# Patient Record
Sex: Male | Born: 1955 | Race: White | Hispanic: No | Marital: Married | Smoking: Never smoker
Health system: Southern US, Community
[De-identification: ages and names within clinical notes are randomized; demographics above are authoritative.]

## PROBLEM LIST (undated history)

## (undated) DIAGNOSIS — C801 Malignant (primary) neoplasm, unspecified: Secondary | ICD-10-CM

## (undated) DIAGNOSIS — E785 Hyperlipidemia, unspecified: Secondary | ICD-10-CM

## (undated) HISTORY — PX: OTHER SURGICAL HISTORY: SHX169

## (undated) HISTORY — DX: Hyperlipidemia, unspecified: E78.5

## (undated) HISTORY — PX: APPENDECTOMY: SHX54

## (undated) HISTORY — PX: PROSTATECTOMY: SHX69

## (undated) HISTORY — DX: Malignant (primary) neoplasm, unspecified: C80.1

---

## 2006-04-14 ENCOUNTER — Ambulatory Visit: Payer: Self-pay | Admitting: Gastroenterology

## 2010-03-27 ENCOUNTER — Ambulatory Visit: Payer: Self-pay | Admitting: Internal Medicine

## 2012-01-13 IMAGING — CR DG CHEST 2V
1 series · 3 of 3 positions shown · non-contrast
Comparison: none

REASON FOR EXAM: sob
COMMENTS:

PROCEDURE:     DXR - DXR CHEST PA (OR AP) AND LATERAL  - March 27, 2010 [DATE]
RESULT:     The lungs are well-expanded and clear. The cardiac silhouette is
normal in size. The pulmonary vascularity is not its course. There is no
pleural effusion.

[Series 1: view not recorded · 0.17mm/px · 3 of 3 slices shown]
[im 1/3]
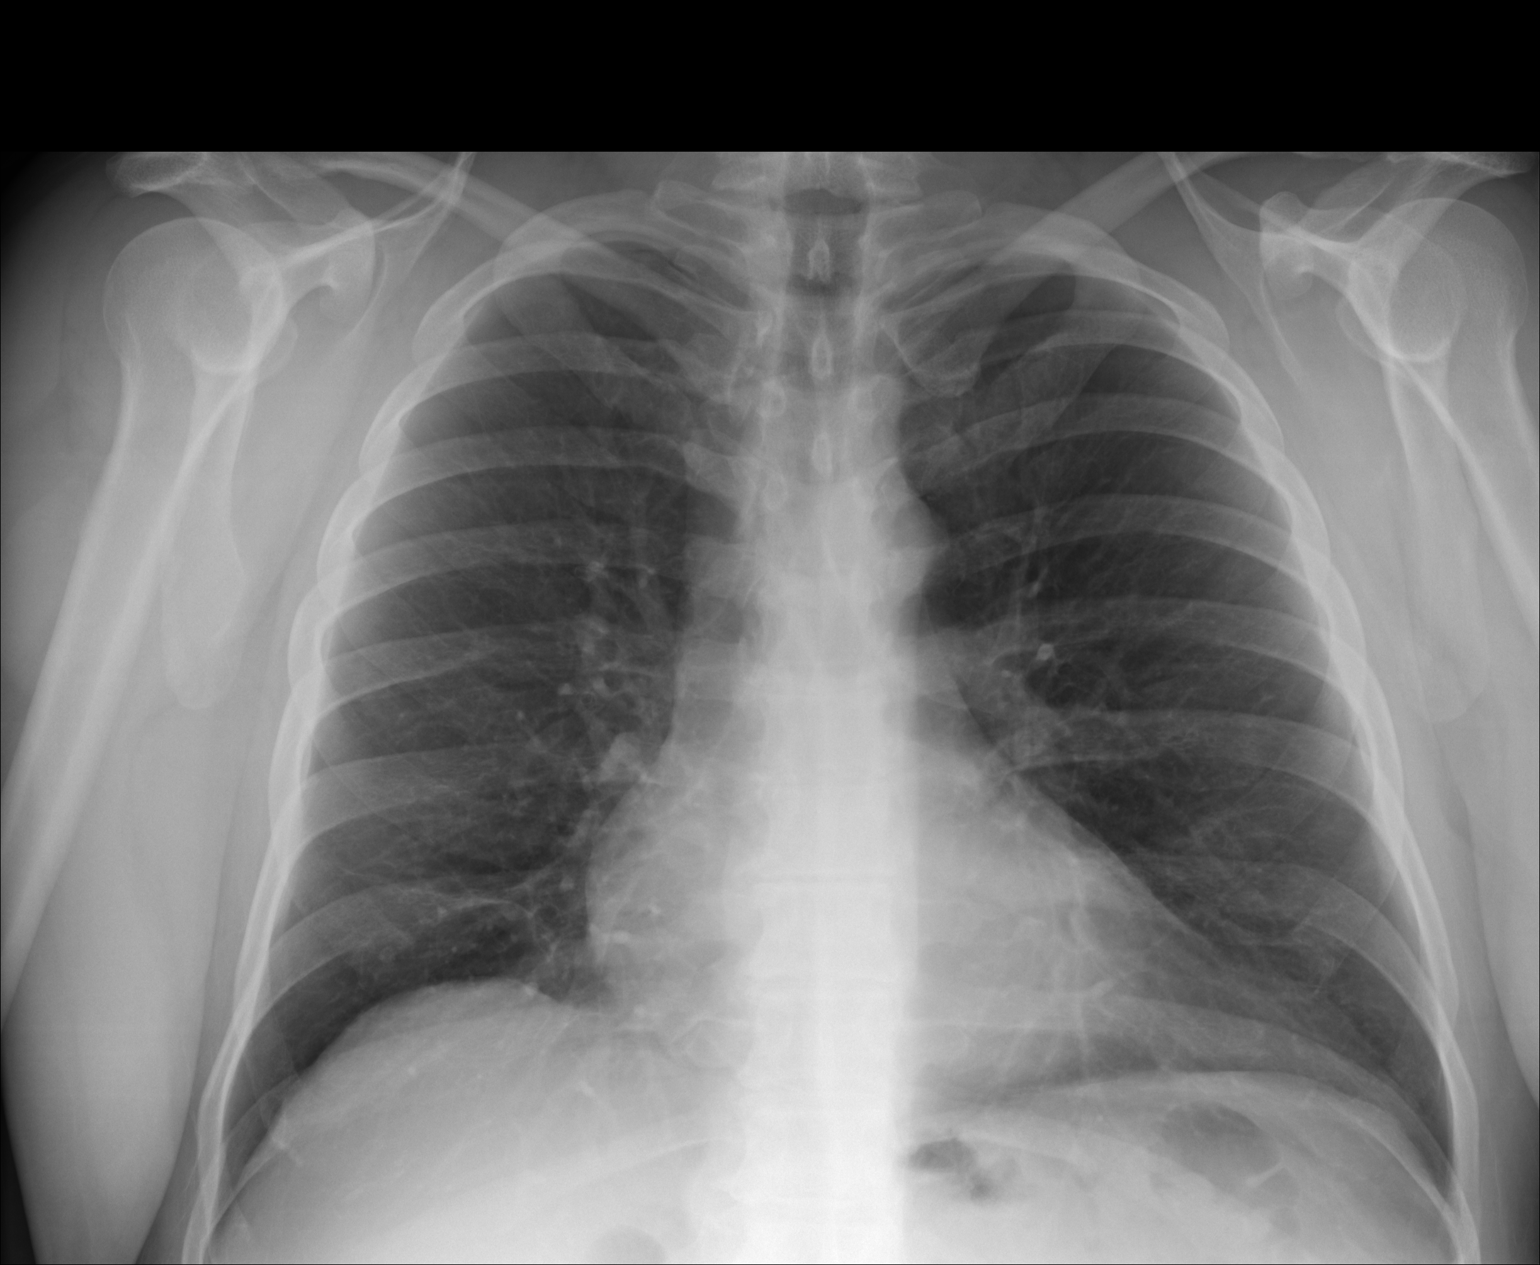
[im 2/3]
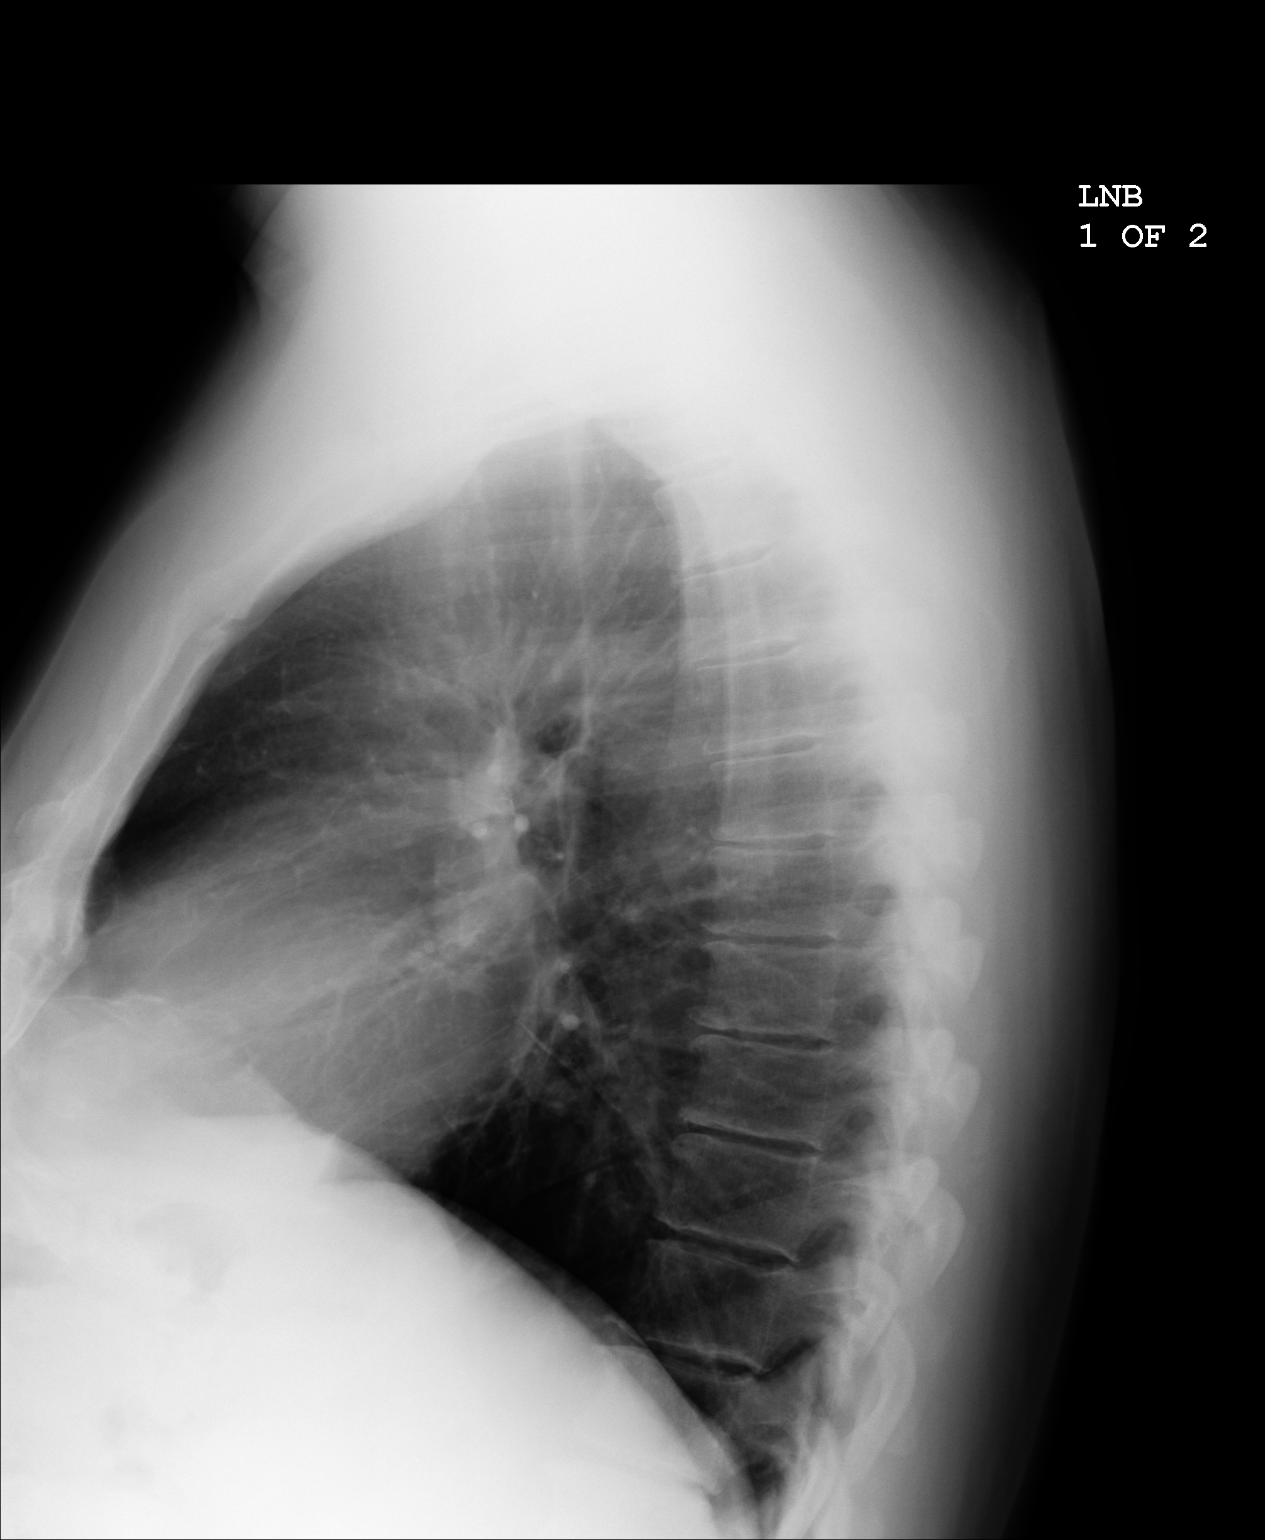
[im 3/3]
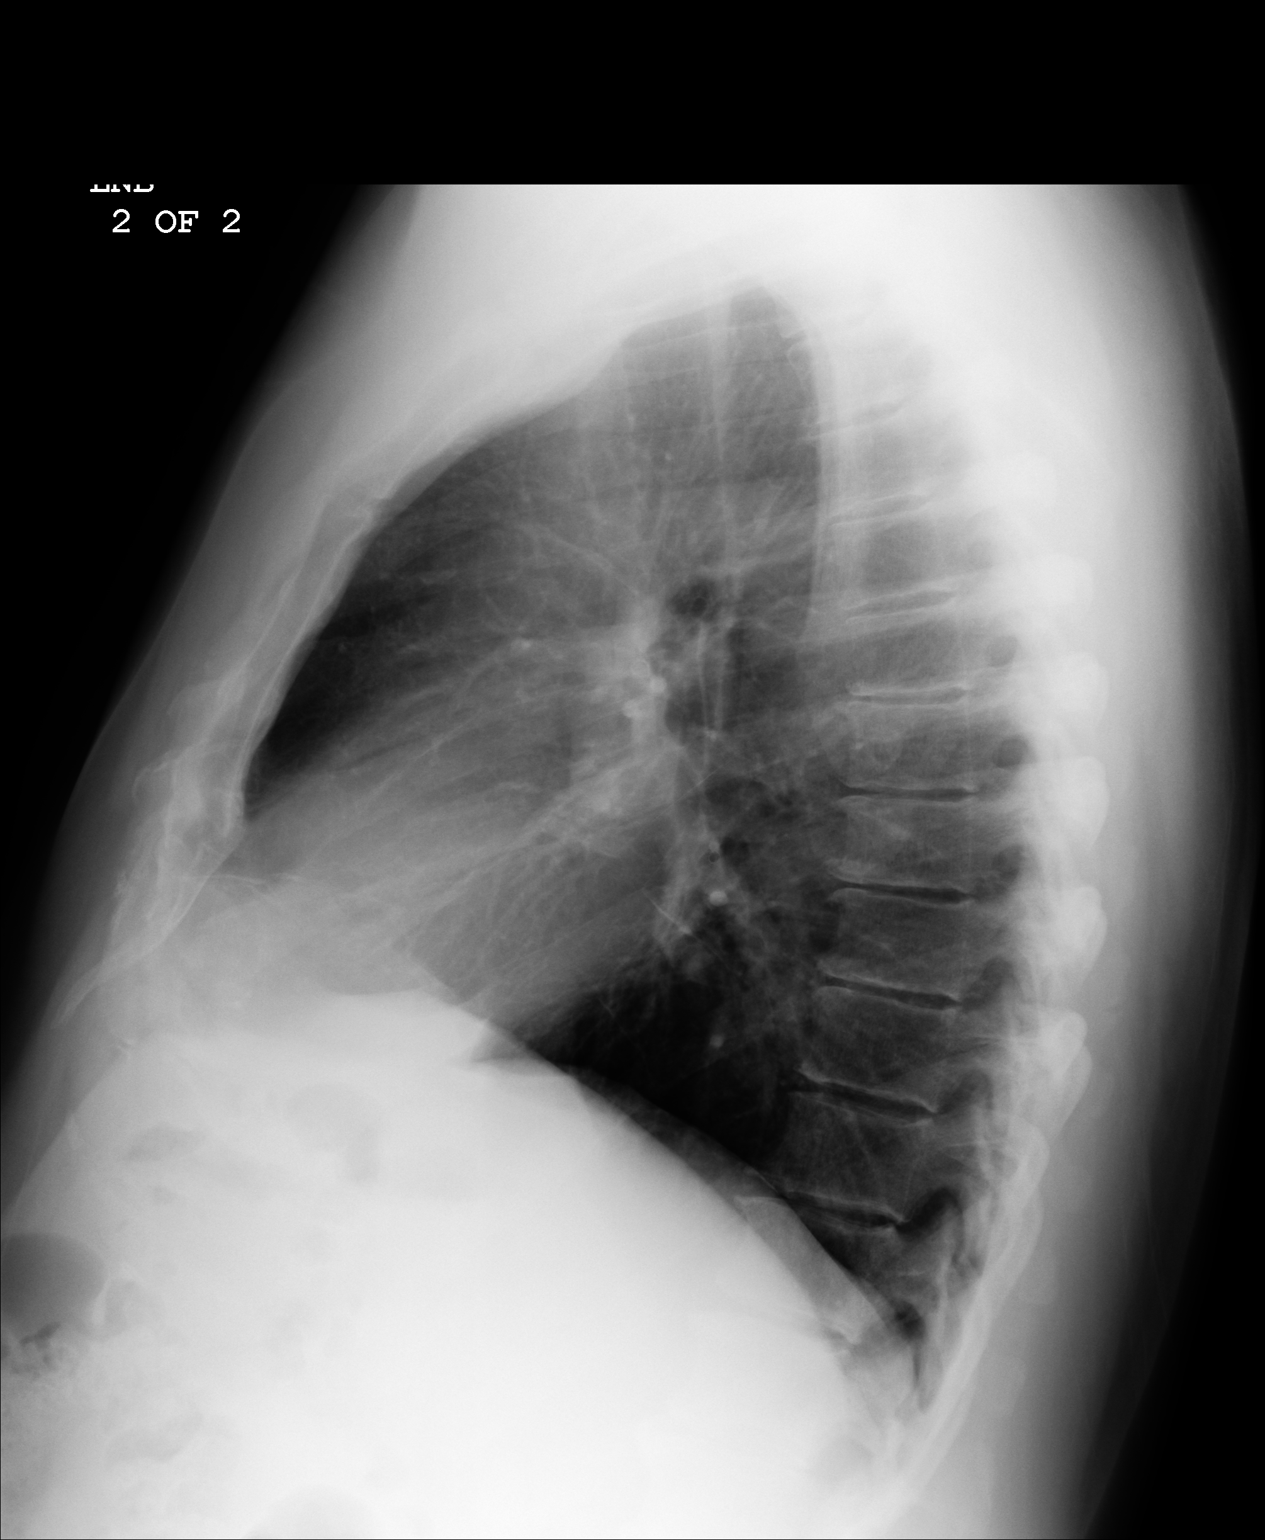

[3 of 3 positions shown; findings below may reference images not displayed]

IMPRESSION: I do not see evidence of pneumonia nor other acute
cardiopulmonary abnormality.

## 2014-11-14 DIAGNOSIS — N320 Bladder-neck obstruction: Secondary | ICD-10-CM | POA: Insufficient documentation

## 2014-11-14 DIAGNOSIS — C61 Malignant neoplasm of prostate: Secondary | ICD-10-CM | POA: Insufficient documentation

## 2015-01-18 DIAGNOSIS — E7800 Pure hypercholesterolemia, unspecified: Secondary | ICD-10-CM | POA: Insufficient documentation

## 2016-01-14 DIAGNOSIS — Z6833 Body mass index (BMI) 33.0-33.9, adult: Secondary | ICD-10-CM | POA: Diagnosis not present

## 2016-01-14 DIAGNOSIS — C61 Malignant neoplasm of prostate: Secondary | ICD-10-CM | POA: Diagnosis not present

## 2016-01-14 DIAGNOSIS — Z79899 Other long term (current) drug therapy: Secondary | ICD-10-CM | POA: Diagnosis not present

## 2016-04-10 DIAGNOSIS — Z0000 Encounter for general adult medical examination without abnormal findings: Secondary | ICD-10-CM | POA: Diagnosis not present

## 2016-04-10 DIAGNOSIS — I10 Essential (primary) hypertension: Secondary | ICD-10-CM | POA: Diagnosis not present

## 2016-04-10 DIAGNOSIS — R7301 Impaired fasting glucose: Secondary | ICD-10-CM | POA: Diagnosis not present

## 2016-04-14 DIAGNOSIS — D485 Neoplasm of uncertain behavior of skin: Secondary | ICD-10-CM | POA: Diagnosis not present

## 2016-04-14 DIAGNOSIS — Z85828 Personal history of other malignant neoplasm of skin: Secondary | ICD-10-CM | POA: Diagnosis not present

## 2016-04-14 DIAGNOSIS — D0471 Carcinoma in situ of skin of right lower limb, including hip: Secondary | ICD-10-CM | POA: Diagnosis not present

## 2016-04-14 DIAGNOSIS — Z08 Encounter for follow-up examination after completed treatment for malignant neoplasm: Secondary | ICD-10-CM | POA: Diagnosis not present

## 2016-04-14 DIAGNOSIS — L570 Actinic keratosis: Secondary | ICD-10-CM | POA: Diagnosis not present

## 2016-04-15 DIAGNOSIS — R001 Bradycardia, unspecified: Secondary | ICD-10-CM | POA: Diagnosis not present

## 2016-04-15 DIAGNOSIS — Z8546 Personal history of malignant neoplasm of prostate: Secondary | ICD-10-CM | POA: Diagnosis not present

## 2016-04-15 DIAGNOSIS — N181 Chronic kidney disease, stage 1: Secondary | ICD-10-CM | POA: Diagnosis not present

## 2016-06-16 DIAGNOSIS — D0471 Carcinoma in situ of skin of right lower limb, including hip: Secondary | ICD-10-CM | POA: Diagnosis not present

## 2016-06-16 DIAGNOSIS — L570 Actinic keratosis: Secondary | ICD-10-CM | POA: Diagnosis not present

## 2016-07-09 DIAGNOSIS — E6609 Other obesity due to excess calories: Secondary | ICD-10-CM | POA: Diagnosis not present

## 2016-07-09 DIAGNOSIS — Z23 Encounter for immunization: Secondary | ICD-10-CM | POA: Diagnosis not present

## 2016-07-09 DIAGNOSIS — N181 Chronic kidney disease, stage 1: Secondary | ICD-10-CM | POA: Diagnosis not present

## 2016-07-09 DIAGNOSIS — M7662 Achilles tendinitis, left leg: Secondary | ICD-10-CM | POA: Diagnosis not present

## 2016-07-09 DIAGNOSIS — Z0001 Encounter for general adult medical examination with abnormal findings: Secondary | ICD-10-CM | POA: Diagnosis not present

## 2016-07-14 DIAGNOSIS — Z6834 Body mass index (BMI) 34.0-34.9, adult: Secondary | ICD-10-CM | POA: Diagnosis not present

## 2016-07-14 DIAGNOSIS — C61 Malignant neoplasm of prostate: Secondary | ICD-10-CM | POA: Diagnosis not present

## 2016-07-14 DIAGNOSIS — R102 Pelvic and perineal pain: Secondary | ICD-10-CM | POA: Diagnosis not present

## 2016-12-15 DIAGNOSIS — L570 Actinic keratosis: Secondary | ICD-10-CM | POA: Diagnosis not present

## 2016-12-15 DIAGNOSIS — Z85828 Personal history of other malignant neoplasm of skin: Secondary | ICD-10-CM | POA: Diagnosis not present

## 2016-12-15 DIAGNOSIS — Z08 Encounter for follow-up examination after completed treatment for malignant neoplasm: Secondary | ICD-10-CM | POA: Diagnosis not present

## 2017-01-06 DIAGNOSIS — N181 Chronic kidney disease, stage 1: Secondary | ICD-10-CM | POA: Diagnosis not present

## 2017-01-06 DIAGNOSIS — E782 Mixed hyperlipidemia: Secondary | ICD-10-CM | POA: Diagnosis not present

## 2017-01-26 DIAGNOSIS — K9189 Other postprocedural complications and disorders of digestive system: Secondary | ICD-10-CM | POA: Diagnosis not present

## 2017-01-26 DIAGNOSIS — Z6834 Body mass index (BMI) 34.0-34.9, adult: Secondary | ICD-10-CM | POA: Diagnosis not present

## 2017-01-26 DIAGNOSIS — Z79899 Other long term (current) drug therapy: Secondary | ICD-10-CM | POA: Diagnosis not present

## 2017-01-26 DIAGNOSIS — K567 Ileus, unspecified: Secondary | ICD-10-CM | POA: Diagnosis not present

## 2017-01-26 DIAGNOSIS — C61 Malignant neoplasm of prostate: Secondary | ICD-10-CM | POA: Diagnosis not present

## 2017-01-26 DIAGNOSIS — Z9889 Other specified postprocedural states: Secondary | ICD-10-CM | POA: Diagnosis not present

## 2017-07-07 DIAGNOSIS — E7800 Pure hypercholesterolemia, unspecified: Secondary | ICD-10-CM | POA: Diagnosis not present

## 2017-07-07 DIAGNOSIS — Z111 Encounter for screening for respiratory tuberculosis: Secondary | ICD-10-CM | POA: Diagnosis not present

## 2017-07-07 DIAGNOSIS — Z0001 Encounter for general adult medical examination with abnormal findings: Secondary | ICD-10-CM | POA: Diagnosis not present

## 2017-07-07 DIAGNOSIS — Z1159 Encounter for screening for other viral diseases: Secondary | ICD-10-CM | POA: Diagnosis not present

## 2017-08-20 DIAGNOSIS — N529 Male erectile dysfunction, unspecified: Secondary | ICD-10-CM | POA: Diagnosis not present

## 2017-08-20 DIAGNOSIS — N320 Bladder-neck obstruction: Secondary | ICD-10-CM | POA: Diagnosis not present

## 2017-08-20 DIAGNOSIS — K4020 Bilateral inguinal hernia, without obstruction or gangrene, not specified as recurrent: Secondary | ICD-10-CM | POA: Diagnosis not present

## 2017-08-20 DIAGNOSIS — C61 Malignant neoplasm of prostate: Secondary | ICD-10-CM | POA: Diagnosis not present

## 2017-12-24 DIAGNOSIS — Z85828 Personal history of other malignant neoplasm of skin: Secondary | ICD-10-CM | POA: Diagnosis not present

## 2017-12-24 DIAGNOSIS — Z08 Encounter for follow-up examination after completed treatment for malignant neoplasm: Secondary | ICD-10-CM | POA: Diagnosis not present

## 2017-12-24 DIAGNOSIS — D485 Neoplasm of uncertain behavior of skin: Secondary | ICD-10-CM | POA: Diagnosis not present

## 2017-12-24 DIAGNOSIS — D2339 Other benign neoplasm of skin of other parts of face: Secondary | ICD-10-CM | POA: Diagnosis not present

## 2018-04-01 DIAGNOSIS — C61 Malignant neoplasm of prostate: Secondary | ICD-10-CM | POA: Diagnosis not present

## 2018-04-01 DIAGNOSIS — Z8546 Personal history of malignant neoplasm of prostate: Secondary | ICD-10-CM | POA: Diagnosis not present

## 2018-04-01 DIAGNOSIS — Z08 Encounter for follow-up examination after completed treatment for malignant neoplasm: Secondary | ICD-10-CM | POA: Diagnosis not present

## 2018-04-01 DIAGNOSIS — Z6833 Body mass index (BMI) 33.0-33.9, adult: Secondary | ICD-10-CM | POA: Diagnosis not present

## 2018-07-13 ENCOUNTER — Encounter: Payer: Self-pay | Admitting: Nurse Practitioner

## 2018-07-13 ENCOUNTER — Ambulatory Visit (INDEPENDENT_AMBULATORY_CARE_PROVIDER_SITE_OTHER): Payer: BLUE CROSS/BLUE SHIELD | Admitting: Nurse Practitioner

## 2018-07-13 VITALS — BP 129/80 | HR 75 | Resp 16 | Ht 69.0 in | Wt 229.0 lb

## 2018-07-13 DIAGNOSIS — Z8546 Personal history of malignant neoplasm of prostate: Secondary | ICD-10-CM | POA: Diagnosis not present

## 2018-07-13 DIAGNOSIS — Z0001 Encounter for general adult medical examination with abnormal findings: Secondary | ICD-10-CM

## 2018-07-13 DIAGNOSIS — Z1211 Encounter for screening for malignant neoplasm of colon: Secondary | ICD-10-CM

## 2018-07-13 NOTE — Progress Notes (Signed)
William Ward, [place] [id number]  Internal MEDICINE  Office Visit Note  Patient Name: William Ward  [id number]  [id number]  Date of Service: 07/13/2018   Pt is here for routine health maintenance examination  Chief Complaint  Patient presents with  . Hyperlipidemia  . Annual Exam  . Quality Metric Gaps    colonoscopy and flu shot      The patient is here for routine health maintenance exam today. He has no concerns or complaints. He is due to have routine, fasting blood work drawn. He is due for colon cancer screen. Did have FOBT last year which was negative. Blood pressure is well controlled. He is on no medications except for baby aspirin. The patient does have history of prostate cancer and sees urologist twice a year and has PSA levels checked there. He also sees dermatologist once yearly for skin check.     Current Medication Outpatient Encounter Medications as of 07/13/2018  Medication Sig  . aspirin EC 81 MG tablet Take by mouth.   No facility-administered encounter medications on file as of 07/13/2018.     Surgical History: Past Surgical History:  Procedure Laterality Date  . APPENDECTOMY    . left hydrocele    . PROSTATECTOMY      Medical History: Past Medical History:  Diagnosis Date  . Cancer ([place])    Prostrate  . Hyperlipidemia     Family History: Family History  Problem Relation Age of Onset  . Heart disease Father   . Kidney disease Father   . Heart disease Brother   . Bladder Cancer Maternal Grandfather   . Heart Problems Paternal Grandfather       Review of Systems  Constitutional: Negative for chills, fatigue and unexpected weight change.  HENT: Negative for congestion, postnasal drip, rhinorrhea, sneezing and sore throat.   Respiratory: Negative for cough, chest tightness, shortness of breath and wheezing.   Cardiovascular: Negative for chest pain and palpitations.  Gastrointestinal: Negative for  abdominal pain, constipation, diarrhea, nausea and vomiting.  Endocrine: Negative for cold intolerance, heat intolerance, polydipsia and polyuria.  Genitourinary: Negative for dysuria and frequency.       Sees urology twice yearly due to history of prostate cancer.   Musculoskeletal: Negative for arthralgias, back pain, joint swelling and neck pain.  Skin: Negative for rash.  Allergic/Immunologic: Negative for environmental allergies.  Neurological: Negative for dizziness, tremors, numbness and headaches.  Hematological: Negative for adenopathy. Does not bruise/bleed easily.  Psychiatric/Behavioral: Negative for behavioral problems (Depression), sleep disturbance and suicidal ideas. The patient is not nervous/anxious.      Today's Vitals   07/13/18 0908  BP: 129/80  Pulse: 75  Resp: 16  SpO2: 96%  Weight: 229 lb (103.9 kg)  Height: 5\' 9"  (1.753 m)    Physical Exam  Constitutional: He is oriented to person, place, and time. He appears well-developed and well-nourished. No distress.  HENT:  Head: Normocephalic and atraumatic.  Nose: Nose normal.  Mouth/Throat: No oropharyngeal exudate.  Eyes: Pupils are equal, round, and reactive to light. EOM are normal.  Neck: Normal range of motion. Neck supple. No JVD present. Carotid bruit is not present. No tracheal deviation present. No thyromegaly present.  Cardiovascular: Normal rate, regular rhythm, normal heart sounds and intact distal pulses. Exam reveals no gallop and no friction rub.  No murmur heard. Pulmonary/Chest: Effort normal and breath sounds normal. No respiratory distress. He has no wheezes. He has no rales. He  exhibits no tenderness.  Abdominal: Soft. Bowel sounds are normal. There is no tenderness.  Musculoskeletal: Normal range of motion.  Lymphadenopathy:    He has no cervical adenopathy.  Neurological: He is alert and oriented to person, place, and time. No cranial nerve deficit.  Skin: Skin is warm and dry. He is not  diaphoretic.  Psychiatric: He has a normal mood and affect. His behavior is normal. Judgment and thought content normal.  Nursing note and vitals reviewed.  Assessment/Plan: 1. Encounter for general adult medical examination with abnormal findings Annual health maintenance exam today. Routine, fasting labs ordered.  - CBC with Differential/Platelet - Comprehensive metabolic panel - Lipid panel - TSH  2. Personal history of prostate cancer Continue regular visits with urology for monitoring.   3. Screening for colon cancer - Fecal occult blood, imunochemical  General Counseling: [name] verbalizes understanding of the findings of todays visit and agrees with plan of treatment. I have discussed any further diagnostic evaluation that may be needed or ordered today. We also reviewed his medications today. he has been encouraged to call the office with any questions or concerns that should arise related to todays visit.    Counseling:  This patient was seen by Leretha Pol FNP Collaboration with Dr Lavera Guise as a part of collaborative care agreement  Orders Placed This Encounter  Procedures  . Fecal occult blood, imunochemical  . CBC with Differential/Platelet  . Comprehensive metabolic panel  . Lipid panel  . TSH      Time spent: [place], MD  Internal Medicine

## 2019-01-13 DIAGNOSIS — R972 Elevated prostate specific antigen [PSA]: Secondary | ICD-10-CM | POA: Diagnosis not present

## 2019-07-11 ENCOUNTER — Other Ambulatory Visit: Payer: Self-pay | Admitting: Nurse Practitioner

## 2019-07-11 DIAGNOSIS — Z0001 Encounter for general adult medical examination with abnormal findings: Secondary | ICD-10-CM | POA: Diagnosis not present

## 2019-07-11 DIAGNOSIS — E782 Mixed hyperlipidemia: Secondary | ICD-10-CM | POA: Diagnosis not present

## 2019-07-12 DIAGNOSIS — Z1211 Encounter for screening for malignant neoplasm of colon: Secondary | ICD-10-CM | POA: Diagnosis not present

## 2019-07-12 LAB — COMPREHENSIVE METABOLIC PANEL
ALT: 18 IU/L (ref 0–44)
AST: 19 IU/L (ref 0–40)
Albumin/Globulin Ratio: 1.7 (ref 1.2–2.2)
Albumin: 4.3 g/dL (ref 3.8–4.8)
Alkaline Phosphatase: 62 IU/L (ref 39–117)
BUN/Creatinine Ratio: 11 (ref 10–24)
BUN: 14 mg/dL (ref 8–27)
Bilirubin Total: 0.8 mg/dL (ref 0.0–1.2)
CO2: 20 mmol/L (ref 20–29)
Calcium: 9.2 mg/dL (ref 8.6–10.2)
Chloride: 105 mmol/L (ref 96–106)
Creatinine, Ser: 1.24 mg/dL (ref 0.76–1.27)
GFR calc Af Amer: 71 mL/min/{1.73_m2} (ref >59)
GFR calc non Af Amer: 61 mL/min/{1.73_m2} (ref >59)
Globulin, Total: 2.6 g/dL (ref 1.5–4.5)
Glucose: 107 mg/dL — ABNORMAL HIGH (ref 65–99)
Potassium: 4.3 mmol/L (ref 3.5–5.2)
Sodium: 140 mmol/L (ref 134–144)
Total Protein: 6.9 g/dL (ref 6.0–8.5)

## 2019-07-12 LAB — CBC
Hematocrit: 49.4 % (ref 37.5–51.0)
Hemoglobin: 17.2 g/dL (ref 13.0–17.7)
MCH: 30.4 pg (ref 26.6–33.0)
MCHC: 34.8 g/dL (ref 31.5–35.7)
MCV: 87 fL (ref 79–97)
Platelets: 218 10*3/uL (ref 150–450)
RBC: 5.66 x10E6/uL (ref 4.14–5.80)
RDW: 13.1 % (ref 11.6–15.4)
WBC: 8.1 10*3/uL (ref 3.4–10.8)

## 2019-07-12 LAB — LIPID PANEL W/O CHOL/HDL RATIO
Cholesterol, Total: 146 mg/dL (ref 100–199)
HDL: 26 mg/dL — ABNORMAL LOW (ref >39)
LDL Chol Calc (NIH): 86 mg/dL (ref 0–99)
Triglycerides: 197 mg/dL — ABNORMAL HIGH (ref 0–149)
VLDL Cholesterol Cal: 34 mg/dL (ref 5–40)

## 2019-07-12 LAB — T3: T3, Total: 164 ng/dL (ref 71–180)

## 2019-07-12 LAB — TSH: TSH: 2.06 u[IU]/mL (ref 0.450–4.500)

## 2019-07-12 LAB — T4, FREE: Free T4: 1.22 ng/dL (ref 0.82–1.77)

## 2019-07-13 NOTE — Progress Notes (Signed)
Labs good. Discuss at next in-office visit

## 2019-07-14 LAB — FECAL OCCULT BLOOD, IMMUNOCHEMICAL: Fecal Occult Bld: NEGATIVE

## 2019-07-14 NOTE — Progress Notes (Signed)
Negative FOBT. Discuss with patient at visit 07/19/2019

## 2019-07-15 ENCOUNTER — Telehealth: Payer: Self-pay

## 2019-07-15 NOTE — Telephone Encounter (Signed)
LMOM FOR PATIENT TO CONFIRM AND SCREEN FOR 07-19-19 OV. 

## 2019-07-19 ENCOUNTER — Other Ambulatory Visit: Payer: Self-pay

## 2019-07-19 ENCOUNTER — Ambulatory Visit (INDEPENDENT_AMBULATORY_CARE_PROVIDER_SITE_OTHER): Payer: BC Managed Care – PPO | Admitting: Nurse Practitioner

## 2019-07-19 ENCOUNTER — Encounter: Payer: Self-pay | Admitting: Nurse Practitioner

## 2019-07-19 VITALS — BP 145/73 | HR 51 | Resp 16 | Ht 66.0 in | Wt 228.0 lb

## 2019-07-19 DIAGNOSIS — Z0001 Encounter for general adult medical examination with abnormal findings: Secondary | ICD-10-CM | POA: Diagnosis not present

## 2019-07-19 DIAGNOSIS — Z1211 Encounter for screening for malignant neoplasm of colon: Secondary | ICD-10-CM

## 2019-07-19 DIAGNOSIS — E7800 Pure hypercholesterolemia, unspecified: Secondary | ICD-10-CM

## 2019-07-19 DIAGNOSIS — M79671 Pain in right foot: Secondary | ICD-10-CM

## 2019-07-19 NOTE — Progress Notes (Signed)
[facility] [place], [place] [address]  Internal MEDICINE  Office Visit Note  Patient Name: William Ward  [id number]  [id number]  Date of Service: 07/20/2019   Pt is here for routine health maintenance examination  Chief Complaint  Patient presents with  . Medical Management of Chronic Issues  . Annual Exam     The patient is here for health maintenance exam. Today, he states that he has some mild swelling along the out aspect of his right foot. Started about three weeks ago when he twisted his foot. Developed area of swelling. Started wrapping it and changed shoes to make them more supportive. He states that pain and swelling have gradually improved. He is no longer having to wrap it. He is able to walk without a limp. He did have routine, fasting labs done prior to this visit. His triglyceride level was mildly elevated. He states that he has been eating a lot more fast food recently and plans to cut this down or out altogether. His FOBT was negative. His PSA is managed through urology.     Current Medication: Outpatient Encounter Medications as of 07/19/2019  Medication Sig  . aspirin EC 81 MG tablet Take by mouth.   No facility-administered encounter medications on file as of 07/19/2019.    Surgical History: Past Surgical History:  Procedure Laterality Date  . APPENDECTOMY    . left hydrocele    . PROSTATECTOMY      Medical History: Past Medical History:  Diagnosis Date  . Cancer ([place])    Prostrate  . Hyperlipidemia     Family History: Family History  Problem Relation Age of Onset  . Heart disease Father   . Kidney disease Father   . Heart disease Brother   . Bladder Cancer Maternal Grandfather   . Heart Problems Paternal Grandfather       Review of Systems  Constitutional: Negative for activity change, chills, fatigue and unexpected weight change.  HENT: Negative for congestion, postnasal drip, rhinorrhea, sneezing and  sore throat.   Respiratory: Negative for cough, chest tightness and shortness of breath.   Cardiovascular: Negative for chest pain and palpitations.  Gastrointestinal: Negative for abdominal pain, constipation, diarrhea, nausea and vomiting.  Endocrine: Negative for cold intolerance, heat intolerance, polydipsia and polyuria.  Genitourinary: Negative for dysuria and frequency.       PSA managed by urology   Musculoskeletal: Positive for arthralgias. Negative for back pain, joint swelling and neck pain.       Right foot pain.  Skin: Negative for rash.  Allergic/Immunologic: Negative for environmental allergies.  Neurological: Negative for dizziness, tremors, numbness and headaches.  Hematological: Negative for adenopathy. Does not bruise/bleed easily.  Psychiatric/Behavioral: Negative for behavioral problems (Depression), sleep disturbance and suicidal ideas. The patient is not nervous/anxious.      Today's Vitals   07/19/19 0925  BP: (!) 145/73  Pulse: (!) 51  Resp: 16  SpO2: 96%  Weight: 228 lb (103.4 kg)  Height: 5\' 6"  (1.676 m)   Body mass index is 36.8 kg/m.   Physical Exam Vitals and nursing note reviewed.  Constitutional:      General: He is not in acute distress.    Appearance: Normal appearance. He is well-developed. He is not diaphoretic.  HENT:     Head: Normocephalic and atraumatic.     Nose: Nose normal.     Mouth/Throat:     Pharynx: No oropharyngeal exudate.  Eyes:  Extraocular Movements: Extraocular movements intact.     Conjunctiva/sclera: Conjunctivae normal.     Pupils: Pupils are equal, round, and reactive to light.  Neck:     Thyroid: No thyromegaly.     Vascular: No carotid bruit or JVD.     Trachea: No tracheal deviation.  Cardiovascular:     Rate and Rhythm: Normal rate and regular rhythm.     Pulses: Normal pulses.     Heart sounds: Normal heart sounds. No murmur. No friction rub. No gallop.   Pulmonary:     Effort: Pulmonary effort is  normal. No respiratory distress.     Breath sounds: Normal breath sounds. No wheezing or rales.  Chest:     Chest wall: No tenderness.  Abdominal:     General: Bowel sounds are normal.     Palpations: Abdomen is soft.     Tenderness: There is no abdominal tenderness.  Musculoskeletal:        General: Normal range of motion.     Cervical back: Normal range of motion and neck supple.       Legs:  Lymphadenopathy:     Cervical: No cervical adenopathy.  Skin:    General: Skin is warm and dry.  Neurological:     Mental Status: He is alert and oriented to person, place, and time.     Cranial Nerves: No cranial nerve deficit.  Psychiatric:        Behavior: Behavior normal.        Thought Content: Thought content normal.        Judgment: Judgment normal.      LABS: Recent Results (from the past 2160 hour(s))  Comprehensive metabolic panel     Status: Abnormal   Collection Time: 07/11/19  8:36 AM  Result Value Ref Range   Glucose 107 (H) 65 - 99 mg/dL   BUN 14 8 - 27 mg/dL   Creatinine, Ser 1.24 0.76 - 1.27 mg/dL   GFR calc non Af Amer 61 >59 mL/min/1.73   GFR calc Af Amer 71 >59 mL/min/1.73   BUN/Creatinine Ratio 11 10 - 24   Sodium 140 134 - 144 mmol/L   Potassium 4.3 3.5 - 5.2 mmol/L   Chloride 105 96 - 106 mmol/L   CO2 20 20 - 29 mmol/L   Calcium 9.2 8.6 - 10.2 mg/dL   Total Protein 6.9 6.0 - 8.5 g/dL   Albumin 4.3 3.8 - 4.8 g/dL   Globulin, Total 2.6 1.5 - 4.5 g/dL   Albumin/Globulin Ratio 1.7 1.2 - 2.2   Bilirubin Total 0.8 0.0 - 1.2 mg/dL   Alkaline Phosphatase 62 39 - 117 IU/L   AST 19 0 - 40 IU/L   ALT 18 0 - 44 IU/L  CBC     Status: None   Collection Time: 07/11/19  8:36 AM  Result Value Ref Range   WBC 8.1 3.4 - 10.8 x10E3/uL   RBC 5.66 4.14 - 5.80 x10E6/uL   Hemoglobin 17.2 13.0 - 17.7 g/dL   Hematocrit 49.4 37.5 - 51.0 %   MCV 87 79 - 97 fL   MCH 30.4 26.6 - 33.0 pg   MCHC 34.8 31.5 - 35.7 g/dL   RDW 13.1 11.6 - 15.4 %   Platelets 218 150 - 450  x10E3/uL  Lipid Panel w/o Chol/HDL Ratio     Status: Abnormal   Collection Time: 07/11/19  8:36 AM  Result Value Ref Range   Cholesterol, Total 146 100 - 199 mg/dL  Triglycerides 197 (H) 0 - 149 mg/dL   HDL 26 (L) >39 mg/dL   VLDL Cholesterol Cal 34 5 - 40 mg/dL   LDL Chol Calc (NIH) 86 0 - 99 mg/dL  T4, free     Status: None   Collection Time: 07/11/19  8:36 AM  Result Value Ref Range   Free T4 1.22 0.82 - 1.77 ng/dL  TSH     Status: None   Collection Time: 07/11/19  8:36 AM  Result Value Ref Range   TSH 2.060 0.450 - 4.500 uIU/mL  T3     Status: None   Collection Time: 07/11/19  8:36 AM  Result Value Ref Range   T3, Total 164 71 - 180 ng/dL  Fecal occult blood, imunochemical     Status: None   Collection Time: 07/12/19  8:00 AM   ST  Result Value Ref Range   Fecal Occult Bld Negative Negative    .Assessment/Plan: 1. Encounter for general adult medical examination with abnormal findings Annual health maintenance exam today  2. High cholesterol Mild elevation of triglycerides on recent labs. Patient to cut back intake of fried and fatty foods. Increase physical activity.  3. Pain in right foot Improving. Continue to rest, ice, and elevate the foot. Will consider imaging studies and podiatry referral if no improvement or worsening over next few weeks.   4. Screening for colon cancer FOBT negative.   General Counseling: Kaidan verbalizes understanding of the findings of todays visit and agrees with plan of treatment. I have discussed any further diagnostic evaluation that may be needed or ordered today. We also reviewed his medications today. he has been encouraged to call the office with any questions or concerns that should arise related to todays visit.    Counseling:  This patient was seen by Leretha Pol FNP Collaboration with Dr Lavera Guise as a part of collaborative care agreement   Time spent: [place], MD  Internal Medicine

## 2019-07-20 DIAGNOSIS — M79671 Pain in right foot: Secondary | ICD-10-CM | POA: Insufficient documentation

## 2019-07-20 DIAGNOSIS — Z0001 Encounter for general adult medical examination with abnormal findings: Secondary | ICD-10-CM | POA: Insufficient documentation

## 2019-09-27 DIAGNOSIS — Z20822 Contact with and (suspected) exposure to covid-19: Secondary | ICD-10-CM | POA: Diagnosis not present

## 2020-07-19 ENCOUNTER — Encounter: Payer: BC Managed Care – PPO | Admitting: Nurse Practitioner
